# Patient Record
Sex: Male | Born: 1990 | Race: White | Hispanic: No | Marital: Married | Smoking: Former smoker
Health system: Southern US, Community
[De-identification: ages and names within clinical notes are randomized; demographics above are authoritative.]

## PROBLEM LIST (undated history)

## (undated) HISTORY — PX: APPENDECTOMY: SHX54

---
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
## 2012-06-24 ENCOUNTER — Emergency Department (HOSPITAL_COMMUNITY): Payer: No Typology Code available for payment source

## 2012-06-24 ENCOUNTER — Emergency Department (HOSPITAL_COMMUNITY)
Admission: EM | Admit: 2012-06-24 | Discharge: 2012-06-24 | Disposition: A | Discharge status: Home / Self Care | Payer: No Typology Code available for payment source | Attending: Emergency Medicine | Admitting: Emergency Medicine

## 2012-06-24 ENCOUNTER — Encounter (HOSPITAL_COMMUNITY): Payer: Self-pay

## 2012-06-24 ENCOUNTER — Emergency Department (HOSPITAL_COMMUNITY): Payer: Self-pay

## 2012-06-24 DIAGNOSIS — S139XXA Sprain of joints and ligaments of unspecified parts of neck, initial encounter: Secondary | ICD-10-CM | POA: Insufficient documentation

## 2012-06-24 DIAGNOSIS — S39012A Strain of muscle, fascia and tendon of lower back, initial encounter: Secondary | ICD-10-CM

## 2012-06-24 DIAGNOSIS — S335XXA Sprain of ligaments of lumbar spine, initial encounter: Secondary | ICD-10-CM | POA: Insufficient documentation

## 2012-06-24 DIAGNOSIS — S161XXA Strain of muscle, fascia and tendon at neck level, initial encounter: Secondary | ICD-10-CM

## 2012-06-24 DIAGNOSIS — Y92410 Unspecified street and highway as the place of occurrence of the external cause: Secondary | ICD-10-CM | POA: Insufficient documentation

## 2012-06-24 DIAGNOSIS — Y9389 Activity, other specified: Secondary | ICD-10-CM | POA: Insufficient documentation

## 2012-06-24 MED ORDER — OXYCODONE-ACETAMINOPHEN 5-325 MG PO TABS
2.0000 | ORAL_TABLET | Freq: Once | ORAL | Status: AC
  Administered 2012-06-24: 2 via ORAL

## 2012-06-24 MED ORDER — NAPROXEN 250 MG PO TABS: 250.0000 mg | ORAL_TABLET | Freq: Two times a day (BID) | ORAL | Status: DC

## 2012-06-24 MED ORDER — METHOCARBAMOL 500 MG PO TABS: 1000.0000 mg | ORAL_TABLET | Freq: Four times a day (QID) | ORAL | Status: DC | PRN

## 2012-06-24 MED ORDER — OXYCODONE-ACETAMINOPHEN 5-325 MG PO TABS
ORAL_TABLET | ORAL | Status: AC
  Administered 2012-06-24: 2 via ORAL
  Filled 2012-06-24: qty 2

## 2012-06-24 MED ORDER — OXYCODONE-ACETAMINOPHEN 5-325 MG PO TABS: ORAL_TABLET | ORAL | Status: DC

## 2012-06-24 NOTE — ED Notes (Signed)
Small C-Collar placed on pt due to head and neck pain.

## 2012-06-24 NOTE — ED Notes (Signed)
Notified Dr. Clarene Duke about pt and she is putting in xrays.
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
## 2012-07-14 ENCOUNTER — Encounter: Payer: Self-pay | Admitting: Family Medicine

## 2012-07-25 ENCOUNTER — Encounter: Payer: Self-pay | Admitting: Family Medicine

## 2012-07-25 ENCOUNTER — Ambulatory Visit (INDEPENDENT_AMBULATORY_CARE_PROVIDER_SITE_OTHER): Payer: Self-pay | Admitting: Family Medicine

## 2012-07-25 VITALS — BP 132/78 | Temp 99.0°F | Wt 175.4 lb

## 2012-07-25 DIAGNOSIS — R319 Hematuria, unspecified: Secondary | ICD-10-CM

## 2012-07-25 DIAGNOSIS — D229 Melanocytic nevi, unspecified: Secondary | ICD-10-CM

## 2012-07-25 DIAGNOSIS — D239 Other benign neoplasm of skin, unspecified: Secondary | ICD-10-CM

## 2012-07-25 LAB — POCT URINALYSIS DIPSTICK
Spec Grav, UA: 1.02
pH, UA: 7

## 2012-07-25 NOTE — Patient Instructions (Signed)
Return urine to [place]

## 2012-07-25 NOTE — Progress Notes (Signed)
  Subjective:    Patient ID: Jesse Riley, male    DOB: 12-23-1990, 23 y.o.   MRN: [id number]  Hematuria This is a new problem. The current episode started in the past 7 days. He is experiencing no pain. Irritative symptoms include frequency. Associated symptoms include fever.   He states that he had one time repeat where at the end of his urination he thought he saw a drop of blood mixed in with the urine. He's not had any problems with this since. He denies any lower bowel pain he denies fever chills flank pain. He denies sweats weight loss nausea vomiting diarrhea or appetite change.  Patient has family history of abnormal moles he states his aunt had multiple cancers taken off her back he was concerned about moles on his back. He does not smoke.   Review of Systems  Constitutional: Positive for fever.  Genitourinary: Positive for frequency and hematuria.       Objective:   Physical Exam Lungs are clear, heart regular, abdomen soft, skin warm dry, blood pressure good pulse good. Patient unable to give urinalysis currently he will bring him back later today for [place] to look at. On his back he has multiple light tan moles which are not worrisome but he has 2 small moles on his back one on the right mid area that is very dark and it has a well circumscribed edge but it's still concerning for possibility of atypical melanoma       Assessment & Plan:  Referral to dermatology for removal of mole  Hematuria-he is to bring a urinalysis sample back we will look at it later today if no blood then will read look at this again in 4-6 weeks. He was told that if he starts having gross hematuria he needs call back right away.

## 2012-08-04 ENCOUNTER — Encounter: Payer: Self-pay | Admitting: Family Medicine

## 2012-09-26 ENCOUNTER — Telehealth: Payer: Self-pay | Admitting: Family Medicine

## 2012-09-26 ENCOUNTER — Encounter: Payer: Self-pay | Admitting: Family Medicine

## 2012-09-26 NOTE — Telephone Encounter (Signed)
Clarify date. I'm ok with it

## 2012-09-26 NOTE — Telephone Encounter (Signed)
Please give note

## 2012-09-26 NOTE — Telephone Encounter (Signed)
Note printed.  Called [phone] however there was no answer.

## 2012-09-26 NOTE — Telephone Encounter (Signed)
Requesting a doctors excuse for today for work with no restrictions. He pulled something in his back yesterday at work and he was in pain this morning, but he is treating with anti-inflammatories and muscle relaxers. Will make appt. If not any better soon. Please call Marchelle Folks at [phone] to let me know since its my half day and I will come pick up.

## 2012-09-29 ENCOUNTER — Ambulatory Visit: Payer: Self-pay | Admitting: Family Medicine

## 2012-11-09 ENCOUNTER — Encounter: Payer: Self-pay | Admitting: Family Medicine

## 2012-11-10 ENCOUNTER — Encounter: Payer: Self-pay | Admitting: Family Medicine

## 2012-11-27 ENCOUNTER — Encounter: Payer: Self-pay | Admitting: Family Medicine

## 2013-06-04 ENCOUNTER — Encounter: Payer: Self-pay | Admitting: Family Medicine

## 2013-06-11 ENCOUNTER — Encounter: Payer: BC Managed Care – PPO | Admitting: Family Medicine

## 2013-08-17 ENCOUNTER — Encounter: Payer: BC Managed Care – PPO | Admitting: Family Medicine

## 2013-10-25 ENCOUNTER — Encounter: Payer: BC Managed Care – PPO | Admitting: Family Medicine

## 2014-02-15 ENCOUNTER — Ambulatory Visit (INDEPENDENT_AMBULATORY_CARE_PROVIDER_SITE_OTHER): Payer: PRIVATE HEALTH INSURANCE | Admitting: Family Medicine

## 2014-02-15 ENCOUNTER — Encounter: Payer: Self-pay | Admitting: Family Medicine

## 2014-02-15 VITALS — BP 120/78 | Temp 98.6°F | Wt 185.2 lb

## 2014-02-15 DIAGNOSIS — R319 Hematuria, unspecified: Secondary | ICD-10-CM

## 2014-02-15 LAB — POCT URINALYSIS DIPSTICK
PH UA: 6
Spec Grav, UA: 1.025

## 2014-02-15 NOTE — Progress Notes (Signed)
   Subjective:    Patient ID: Jesse Riley, male    DOB: [birth year], 23 y.o.   MRN: [id number]  Hematuria This is a new problem. The current episode started today. The problem is unchanged. The hematuria occurs during the initial portion of his urinary stream. He reports no clotting in his urine stream. His pain is at a severity of 0/10. He is experiencing no pain. (Sharp pains around rib cage) He is sexually active.  Patient states he has no other concerns at this time.   PMH benign  Review of Systems  Genitourinary: Positive for hematuria.  patient denies fever vomiting diarrhea. Denies flank pain or dysuria     Objective:   Physical Exam  Lungs are clear hearts regular abdomen soft no guarding rebound flanks nontender genitourinary normal  Urinalysis without RBCs    Assessment & Plan:  hematuria-because this is intermittent and is months at a time I doubt underlying cancer but I do believe because it keeps reoccurring the patient needs cystoscope. Referral to [facility].  Lab work ordered.

## 2014-02-20 ENCOUNTER — Encounter: Payer: Self-pay | Admitting: Family Medicine

## 2014-04-30 ENCOUNTER — Ambulatory Visit: Payer: Self-pay | Admitting: Urology
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
## 2014-11-18 ENCOUNTER — Telehealth: Payer: Self-pay | Admitting: Family Medicine

## 2014-11-18 MED ORDER — PREDNISONE 20 MG PO TABS: ORAL_TABLET | ORAL | Status: DC

## 2014-11-18 NOTE — Telephone Encounter (Signed)
Medication sent to the pharmacy. Left message on voicemail notifying patient.

## 2014-11-18 NOTE — Telephone Encounter (Signed)
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  It is spreading further up his arms.  He has tried calamine lotion, vinegar, and several other OTC products.  Can we call in something for this? He is self-pay, so cheapest preferred.    [facility] [place]

## 2014-11-18 NOTE — Telephone Encounter (Signed)
Treatment for this that should be affordable his prednisone tablets., #18,3qd for 3d then 2qd for 3d then 1qd for 3d

## 2015-12-17 ENCOUNTER — Telehealth: Payer: Self-pay | Admitting: Family Medicine

## 2015-12-17 ENCOUNTER — Other Ambulatory Visit: Payer: Self-pay | Admitting: Family Medicine

## 2015-12-17 DIAGNOSIS — R5383 Other fatigue: Secondary | ICD-10-CM

## 2015-12-17 DIAGNOSIS — Z13220 Encounter for screening for lipoid disorders: Secondary | ICD-10-CM

## 2015-12-17 DIAGNOSIS — Z79899 Other long term (current) drug therapy: Secondary | ICD-10-CM

## 2015-12-17 NOTE — Telephone Encounter (Signed)
Pt is needing lab orders to be sent over for an upcoming physical. There are no previous labs in Epic.

## 2015-12-17 NOTE — Telephone Encounter (Signed)
Lipid, liver, metabolic 7, CBC 

## 2015-12-18 NOTE — Telephone Encounter (Signed)
Notified Estill Bamberg bloodwork has been ordered.

## 2016-01-14 LAB — CBC WITH DIFFERENTIAL/PLATELET
BASOS ABS: 0 10*3/uL (ref 0.0–0.2)
Basos: 0 %
EOS (ABSOLUTE): 0.1 10*3/uL (ref 0.0–0.4)
Eos: 1 %
HEMOGLOBIN: 17.4 g/dL (ref 12.6–17.7)
Hematocrit: 48.3 % (ref 37.5–51.0)
Immature Grans (Abs): 0 10*3/uL (ref 0.0–0.1)
Immature Granulocytes: 0 %
LYMPHS ABS: 2.5 10*3/uL (ref 0.7–3.1)
Lymphs: 39 %
MCH: 30.3 pg (ref 26.6–33.0)
MCHC: 36 g/dL — AB (ref 31.5–35.7)
MCV: 84 fL (ref 79–97)
MONOCYTES: 6 %
MONOS ABS: 0.4 10*3/uL (ref 0.1–0.9)
NEUTROS ABS: 3.5 10*3/uL (ref 1.4–7.0)
Neutrophils: 54 %
Platelets: 219 10*3/uL (ref 150–379)
RBC: 5.75 x10E6/uL (ref 4.14–5.80)
RDW: 12.7 % (ref 12.3–15.4)
WBC: 6.4 10*3/uL (ref 3.4–10.8)

## 2016-01-14 LAB — LIPID PANEL
CHOLESTEROL TOTAL: 178 mg/dL (ref 100–199)
Chol/HDL Ratio: 4.1 ratio units (ref 0.0–5.0)
HDL: 43 mg/dL (ref >39)
LDL Calculated: 105 mg/dL — ABNORMAL HIGH (ref 0–99)
TRIGLYCERIDES: 151 mg/dL — AB (ref 0–149)
VLDL CHOLESTEROL CAL: 30 mg/dL (ref 5–40)

## 2016-01-14 LAB — BASIC METABOLIC PANEL
BUN / CREAT RATIO: 13 (ref 9–20)
BUN: 13 mg/dL (ref 6–20)
CHLORIDE: 99 mmol/L (ref 96–106)
CO2: 25 mmol/L (ref 18–29)
Calcium: 9.7 mg/dL (ref 8.7–10.2)
Creatinine, Ser: 1.02 mg/dL (ref 0.76–1.27)
GFR calc non Af Amer: 101 mL/min/{1.73_m2} (ref >59)
GFR, EST AFRICAN AMERICAN: 117 mL/min/{1.73_m2} (ref >59)
GLUCOSE: 112 mg/dL — AB (ref 65–99)
POTASSIUM: 4.1 mmol/L (ref 3.5–5.2)
SODIUM: 142 mmol/L (ref 134–144)

## 2016-01-14 LAB — HEPATIC FUNCTION PANEL
ALK PHOS: 88 IU/L (ref 39–117)
ALT: 36 IU/L (ref 0–44)
AST: 21 IU/L (ref 0–40)
Albumin: 4.8 g/dL (ref 3.5–5.5)
BILIRUBIN TOTAL: 0.7 mg/dL (ref 0.0–1.2)
BILIRUBIN, DIRECT: 0.12 mg/dL (ref 0.00–0.40)
Total Protein: 7 g/dL (ref 6.0–8.5)

## 2016-01-19 ENCOUNTER — Encounter: Payer: Self-pay | Admitting: Family Medicine

## 2016-01-20 ENCOUNTER — Encounter: Payer: Self-pay | Admitting: Family Medicine

## 2016-01-20 ENCOUNTER — Ambulatory Visit (INDEPENDENT_AMBULATORY_CARE_PROVIDER_SITE_OTHER): Payer: BLUE CROSS/BLUE SHIELD | Admitting: Family Medicine

## 2016-01-20 VITALS — BP 118/78 | Ht 70.0 in | Wt 187.5 lb

## 2016-01-20 DIAGNOSIS — Z0000 Encounter for general adult medical examination without abnormal findings: Secondary | ICD-10-CM

## 2016-01-20 DIAGNOSIS — R7301 Impaired fasting glucose: Secondary | ICD-10-CM

## 2016-01-20 DIAGNOSIS — E7849 Other hyperlipidemia: Secondary | ICD-10-CM

## 2016-01-20 DIAGNOSIS — E784 Other hyperlipidemia: Secondary | ICD-10-CM

## 2016-01-20 NOTE — Progress Notes (Addendum)
   Subjective:    Patient ID: Jesse Riley, male    DOB: 30-Jul-1990, 25 y.o.   MRN: [id number]  HPI The patient comes in today for a wellness visit.  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 He does try to stay active but does not do any exercise on a regular basis he is safe with his driving habits he does not smoke or drink. He does not eat healthy. He drinks too much sweet tea. He does have a family history of diabetes but no premature heart disease. Denies any rectal bleeding or hematuria.  A review of their health history was completed.  A review of medications was also completed.  Any needed refills: no meds currently  Eating habits: not conscious about eating habits  Falls/  MVA accidents in past few months: none  Regular exercise: walking  Specialist pt sees on regular basis: none  Preventative health issues were discussed.   Additional concerns: none    Review of Systems  Constitutional: Negative for activity change, appetite change and fever.  HENT: Negative for congestion and rhinorrhea.   Eyes: Negative for discharge.  Respiratory: Negative for cough and wheezing.   Cardiovascular: Negative for chest pain.  Gastrointestinal: Negative for abdominal pain, blood in stool and vomiting.  Genitourinary: Negative for difficulty urinating and frequency.  Musculoskeletal: Negative for neck pain.  Skin: Negative for rash.  Allergic/Immunologic: Negative for environmental allergies and food allergies.  Neurological: Negative for weakness and headaches.  Psychiatric/Behavioral: Negative for agitation.       Objective:   Physical Exam  Constitutional: He appears well-developed and well-nourished.  HENT:  Head: Normocephalic and atraumatic.  Right Ear: External ear normal.  Left Ear: External ear normal.  Nose: Nose normal.  Mouth/Throat: Oropharynx is clear and moist.  Eyes: EOM are normal. Pupils are equal, round, and reactive to light.  Neck:  Normal range of motion. Neck supple. No thyromegaly present.  Cardiovascular: Normal rate, regular rhythm and normal heart sounds.   No murmur heard. Pulmonary/Chest: Effort normal and breath sounds normal. No respiratory distress. He has no wheezes.  Abdominal: Soft. Bowel sounds are normal. He exhibits no distension and no mass. There is no tenderness.  Genitourinary: Penis normal.  Musculoskeletal: Normal range of motion. He exhibits no edema.  Lymphadenopathy:    He has no cervical adenopathy.  Neurological: He is alert. He exhibits normal muscle tone.  Skin: Skin is warm and dry. No erythema.  Psychiatric: He has a normal mood and affect. His behavior is normal. Judgment normal.    Testicular exam normal, testicular cancer detection discussed      Assessment & Plan:  Adult wellness-complete.wellness physical was conducted today. Importance of diet and exercise were discussed in detail. In addition to this a discussion regarding safety was also covered. We also reviewed over immunizations and gave recommendations regarding current immunization needed for age. In addition to this additional areas were also touched on including: Preventative health exams needed: Colonoscopy Colonoscopy not indicated Prostate exam not indicated Hyperlipidemia minimal elevation watch diet exercise more often Fasting hyperglycemia watch diet work hard on avoiding sugary drinks recheck labs in the spring  Patient was advised yearly wellness exam

## 2017-01-27 ENCOUNTER — Ambulatory Visit: Payer: BLUE CROSS/BLUE SHIELD | Admitting: Adult Health

## 2017-01-27 VITALS — BP 122/64 | HR 83 | Temp 97.3°F | Resp 16 | Ht 71.0 in | Wt 182.0 lb

## 2017-01-27 DIAGNOSIS — H6122 Impacted cerumen, left ear: Secondary | ICD-10-CM

## 2017-01-27 DIAGNOSIS — H6502 Acute serous otitis media, left ear: Secondary | ICD-10-CM

## 2017-01-27 MED ORDER — AZITHROMYCIN 250 MG PO TABS: ORAL_TABLET | ORAL | 0 refills | Status: DC

## 2017-01-27 NOTE — Patient Instructions (Signed)
Azithromycin tablets What is this medicine? AZITHROMYCIN (az ith roe MYE sin) is a macrolide antibiotic. It is used to treat or prevent certain kinds of bacterial infections. It will not work for colds, flu, or other viral infections. This medicine may be used for other purposes; ask your health care provider or pharmacist if you have questions. COMMON BRAND NAME(S): Zithromax, Zithromax Tri-Pak, Zithromax Z-Pak What should I tell my health care provider before I take this medicine? They need to know if you have any of these conditions: -kidney disease -liver disease -irregular heartbeat or heart disease -an unusual or allergic reaction to azithromycin, erythromycin, other macrolide antibiotics, foods, dyes, or preservatives -pregnant or trying to get pregnant -breast-feeding How should I use this medicine? Take this medicine by mouth with a full glass of water. Follow the directions on the prescription label. The tablets can be taken with food or on an empty stomach. If the medicine upsets your stomach, take it with food. Take your medicine at regular intervals. Do not take your medicine more often than directed. Take all of your medicine as directed even if you think your are better. Do not skip doses or stop your medicine early. Talk to your pediatrician regarding the use of this medicine in children. While this drug may be prescribed for children as young as 6 months for selected conditions, precautions do apply. Overdosage: If you think you have taken too much of this medicine contact a poison control center or emergency room at once. NOTE: This medicine is only for you. Do not share this medicine with others. What if I miss a dose? If you miss a dose, take it as soon as you can. If it is almost time for your next dose, take only that dose. Do not take double or extra doses. What may interact with this medicine? Do not take this medicine with any of the following  medications: -lincomycin This medicine may also interact with the following medications: -amiodarone -antacids -birth control pills -cyclosporine -digoxin -magnesium -nelfinavir -phenytoin -warfarin This list may not describe all possible interactions. Give your health care provider a list of all the medicines, herbs, non-prescription drugs, or dietary supplements you use. Also tell them if you smoke, drink alcohol, or use illegal drugs. Some items may interact with your medicine. What should I watch for while using this medicine? Tell your doctor or healthcare professional if your symptoms do not start to get better or if they get worse. Do not treat diarrhea with over the counter products. Contact your doctor if you have diarrhea that lasts more than 2 days or if it is severe and watery. This medicine can make you more sensitive to the sun. Keep out of the sun. If you cannot avoid being in the sun, wear protective clothing and use sunscreen. Do not use sun lamps or tanning beds/booths. What side effects may I notice from receiving this medicine? Side effects that you should report to your doctor or health care professional as soon as possible: -allergic reactions like skin rash, itching or hives, swelling of the face, lips, or tongue -confusion, nightmares or hallucinations -dark urine -difficulty breathing -hearing loss -irregular heartbeat or chest pain -pain or difficulty passing urine -redness, blistering, peeling or loosening of the skin, including inside the mouth -white patches or sores in the mouth -yellowing of the eyes or skin Side effects that usually do not require medical attention (report to your doctor or health care professional if they continue or are bothersome): -  diarrhea -dizziness, drowsiness -headache -stomach upset or vomiting -tooth discoloration -vaginal irritation This list may not describe all possible side effects. Call your doctor for medical advice  about side effects. You may report side effects to FDA at 1-800-FDA-1088. Where should I keep my medicine? Keep out of the reach of children. Store at room temperature between 15 and 30 degrees C (59 and 86 degrees F). Throw away any unused medicine after the expiration date. NOTE: This sheet is a summary. It may not cover all possible information. If you have questions about this medicine, talk to your doctor, pharmacist, or health care provider.  2018 Elsevier/Gold Standard (2015-05-20 15:26:03) Otitis Media, Adult Otitis media is redness, soreness, and puffiness (swelling) in the space just behind your eardrum (middle ear). It may be caused by allergies or infection. It often happens along with a cold. Follow these instructions at home:  Take your medicine as told. Finish it even if you start to feel better.  Only take over-the-counter or prescription medicines for pain, discomfort, or fever as told by your doctor.  Follow up with your doctor as told. Contact a doctor if:  You have otitis media only in one ear, or bleeding from your nose, or both.  You notice a lump on your neck.  You are not getting better in 3-5 days.  You feel worse instead of better. Get help right away if:  You have pain that is not helped with medicine.  You have puffiness, redness, or pain around your ear.  You get a stiff neck.  You cannot move part of your face (paralysis).  You notice that the bone behind your ear hurts when you touch it. This information is not intended to replace advice given to you by your health care provider. Make sure you discuss any questions you have with your health care provider. Document Released: 09/08/2007 Document Revised: 08/28/2015 Document Reviewed: 10/17/2012 Elsevier Interactive Patient Education  2017 [employer].

## 2017-01-27 NOTE — Progress Notes (Addendum)
Subjective:     Patient ID: Jesse Riley, male   DOB: 1990/08/22, 26 y.o.   MRN: [id number]  HPI   Patient is a 26 year old male in no acute distress, he is here because he used a two weeks ago he used a Q-tip to clean his ears. He reports he has a lot of ear wax and he is constantly trying to remove it. He reports he put a q-tip in his left ear  and he poked his ear with it and realized he had no cotton on the swab. He feels the swab likely had no cotton on the end when he inserted it  into his left  ear.  He denies any pain or drainage at all since this happened and ne at the time of incident either.  He denies any change in his normal hearing.  He denies any other symptoms or concerns.   Blood pressure 122/64, pulse 83, temperature (!) 97.3 F (36.3 C), temperature source Tympanic, resp. rate 16, height 5\' 11"  (1.803 m), weight 182 lb (82.6 kg), SpO2 97 %. Recheck temperature 98.4 tympanic.  Allergies  Allergen Reactions  . Ceclor [Cefaclor]    Luking, Elayne Snare, MD- patients  PCP  Patient Active Problem List   Diagnosis Date Noted  . Fasting hyperglycemia 01/20/2016  . Other hyperlipidemia 01/20/2016      Review of Systems  Constitutional: Negative for activity change, appetite change, chills, diaphoresis, fatigue, fever and unexpected weight change.  HENT: Positive for ear pain (tender patient felt when inserting q-tips to remove wax this week as well). Negative for congestion, dental problem, drooling, ear discharge, facial swelling, hearing loss, mouth sores, nosebleeds, postnasal drip, rhinorrhea, sinus pain, sinus pressure, sneezing, sore throat, tinnitus, trouble swallowing and voice change.   Eyes: Negative.   Respiratory: Negative for apnea, cough, choking, chest tightness, shortness of breath, wheezing and stridor.   Cardiovascular: Negative for chest pain, palpitations and leg swelling.  Gastrointestinal: Negative.   Endocrine: Negative.   Genitourinary: Negative.    Musculoskeletal: Negative.   Skin: Negative.   Allergic/Immunologic: Negative.   Neurological: Negative.   Psychiatric/Behavioral: Negative.        Objective:   Physical Exam  Constitutional: He is oriented to person, place, and time. He appears well-developed and well-nourished. No distress.  HENT:  Head: Normocephalic and atraumatic.  Right Ear: Hearing, external ear and ear canal normal. No drainage or tenderness. No foreign bodies. No mastoid tenderness. Tympanic membrane is not perforated and not erythematous. No middle ear effusion.  Left Ear: Hearing, external ear and ear canal normal. No lacerations. No drainage, swelling or tenderness (not with touch or examination). No foreign bodies. No mastoid tenderness. Tympanic membrane is erythematous (mild in portion able to visualize around half of tympanic membrane visualized after irrigation. ). Tympanic membrane is not injected, not perforated (not able to visualize full tympanic membrane/ no perforatioin noted in visualized area), not retracted and not bulging.  No middle ear effusion.  Nose: Nose normal. Right sinus exhibits no maxillary sinus tenderness and no frontal sinus tenderness. Left sinus exhibits no maxillary sinus tenderness and no frontal sinus tenderness.  Mouth/Throat: Uvula is midline, oropharynx is clear and moist and mucous membranes are normal. No oropharyngeal exudate, posterior oropharyngeal edema, posterior oropharyngeal erythema or tonsillar abscesses.  Right ear with mild cerumen present able to fully  visualize tympanic membrane.  Left  ear unable to visualize tympanic membrane due to cerumen in ear canal, irrigated by RN  Sonia Baller, after irrigation was able to partially visualize tympanic membrane and documented under left ear/ other area  View occluded by cerumen.  Patient denied any pain with bilateral irrigation by nurse.   Eyes: Pupils are equal, round, and reactive to light. Conjunctivae, EOM and lids are  normal. Right eye exhibits no discharge. Left eye exhibits no discharge. No scleral icterus.  Neck: Trachea normal, normal range of motion and full passive range of motion without pain. Neck supple. Normal carotid pulses, no hepatojugular reflux and no JVD present. Carotid bruit is not present. No tracheal deviation present. No thyromegaly present.  Cardiovascular: Normal rate, regular rhythm, normal heart sounds and intact distal pulses.  Exam reveals no gallop and no friction rub.   No murmur heard. Pulmonary/Chest: Effort normal and breath sounds normal. No stridor. No respiratory distress. He has no wheezes. He has no rales. He exhibits no tenderness.  Abdominal: Soft.  Musculoskeletal: Normal range of motion. He exhibits no edema or tenderness.  Patient moves on and off of exam table and in room without difficulty. Gait is normal in hall and in room. Patient is oriented to person place time and situation. Patient answers questions appropriately and engages in conversation.   Lymphadenopathy:       Head (right side): No submental, no submandibular, no tonsillar, no preauricular, no posterior auricular and no occipital adenopathy present.       Head (left side): No submental, no submandibular, no tonsillar, no preauricular, no posterior auricular and no occipital adenopathy present.    He has no cervical adenopathy.  Neurological: He is alert and oriented to person, place, and time. He has normal reflexes. No cranial nerve deficit. Coordination normal.  Skin: Skin is warm and dry. He is not diaphoretic.  Psychiatric: He has a normal mood and affect. His speech is normal and behavior is normal. Judgment and thought content normal. Cognition and memory are normal.  Vitals reviewed.      Assessment:     Impacted cerumen of left ear - Plan: Ear Lavage  Acute serous otitis media of left ear, recurrence not specified      Plan:    E prescribed  Meds ordered this encounter  Medications  .  azithromycin (ZITHROMAX) 250 MG tablet    Sig: Take 2 tablets ( 500 mg ) on day one and one tablet (250 mg) for days two three four and five.    Dispense:  6 tablet    Refill:  0    He is advised to keep left ear dry ad do not submerge in water. He is to report any pain or discharge to this clinic and seek medical attention as below if worsens at anytime.    He is advised to schedule for a two week recheck appointment  of his left ear and sooner if any symptoms change or worsen.   He can follow up with Kathyrn Drown, MD as well.   Advised to return to clinic for an appointment if no improvement within 72 hours or if any symptoms change or worsen. Advised ER or urgent Care if after hours or on weekend. 911 for emergency symptoms at any time.   Patient verbalized understanding of instructions and denies any further questions at this time.

## 2017-02-10 ENCOUNTER — Ambulatory Visit: Payer: BLUE CROSS/BLUE SHIELD | Admitting: Adult Health

## 2017-09-22 ENCOUNTER — Ambulatory Visit: Payer: BLUE CROSS/BLUE SHIELD | Admitting: Adult Health

## 2017-09-22 ENCOUNTER — Encounter: Payer: Self-pay | Admitting: Adult Health

## 2017-09-22 VITALS — BP 122/66 | HR 78 | Temp 98.1°F | Resp 16 | Ht 71.0 in | Wt 178.0 lb

## 2017-09-22 DIAGNOSIS — M25531 Pain in right wrist: Secondary | ICD-10-CM

## 2017-09-22 DIAGNOSIS — M79641 Pain in right hand: Secondary | ICD-10-CM

## 2017-09-22 NOTE — Progress Notes (Signed)
Subjective:     Patient ID: Jesse Riley, male   DOB: December 05, 1990, 27 y.o.   MRN: [id number]  Blood pressure 122/66, pulse 78, temperature 98.1 F (36.7 C), resp. rate 16, height 5\' 11"  (1.803 m), weight 178 lb (80.7 kg), SpO2 100 %.  HPI   Patient is a 27 year old male in no acute distress who comes to the clinic in no acute distress. He had a fall while playing  basket ball he had jumped 3 to 4 feet  in the air and fell on right hip and landed with arm outstretched and  palm down on concrete floor. He has pain with bearing weight on hand. Not taking any medications.  He has significant swelling in his right hand/wrist and now has improved some with minimal swelling now. He reports feeling a " knot " at his wrist area when he moves in certain positions. He reports pain in hand while witting or twisting. He is right hand dominant.   Denies any previous known injury. Patient  denies any fever, body aches,chills, rash, chest pain, shortness of breath, nausea, vomiting, or diarrhea.    Review of Systems  Constitutional: Negative.   HENT: Negative.   Eyes: Negative.   Respiratory: Negative.   Cardiovascular: Negative.   Gastrointestinal: Negative.   Endocrine: Negative.   Genitourinary: Negative.   Musculoskeletal: Positive for arthralgias and joint swelling. Negative for back pain, gait problem, myalgias, neck pain and neck stiffness.       Fall over one week ago see HPI note  Fall onto outstretched hand [place] Fall   Skin: Negative.   Allergic/Immunologic:        -- Ceclor (Cefaclor)    Neurological: Negative.   Hematological: Negative.   Psychiatric/Behavioral: Negative.        Objective:   Physical Exam  Constitutional: He is oriented to person, place, and time. He appears well-developed and well-nourished. No distress.  HENT:  Head: Normocephalic and atraumatic.  Nose: Nose normal.  Mouth/Throat: No oropharyngeal exudate.  Eyes: Pupils are equal, round, and reactive to  light. Conjunctivae and EOM are normal.  Neck: Normal range of motion. Neck supple.  Cardiovascular: Normal rate, regular rhythm, normal heart sounds and intact distal pulses. Exam reveals no gallop and no friction rub.  No murmur heard. Pulmonary/Chest: Effort normal and breath sounds normal. No stridor. No respiratory distress. He has no wheezes. He has no rales. He exhibits no tenderness.  Abdominal: Soft.  Musculoskeletal:       Right shoulder: Normal.       Left shoulder: Normal.       Right elbow: Normal.      Left elbow: Normal.       Right wrist: He exhibits decreased range of motion (causes pain with FULL ROM ), tenderness, swelling and deformity (at anterior wrist appers to be small bony protrusion under skin  with certain movements ). He exhibits no effusion, no crepitus and no laceration. Bony tenderness: wrist  and hand        Left wrist: Normal.       Right hip: Normal.       Left hip: Normal.       Arms: Neurological: He is alert and oriented to person, place, and time.  Skin: Skin is warm and dry. Capillary refill takes less than 2 seconds. No rash noted. He is not diaphoretic. No erythema. No pallor.  Psychiatric: He has a normal mood and affect. His behavior is normal.  Judgment and thought content normal.  Vitals reviewed.      Assessment:     Pain in wrist, right  Pain of right hand  FOOSH type injury      Plan:     Refer to [facility] today for further evaluation and work up.   Advised patient call the office or your primary care doctor for an appointment if no improvement within 72 hours or if any symptoms change or worsen at any time  Advised ER or urgent Care if after hours or on weekend. Call 911 for emergency symptoms at any time.Patinet verbalized understanding of all instructions given/reviewed and treatment plan and has no further questions or concerns at this time.    Patient verbalized understanding of all instructions given  and denies any further questions at this time.

## 2017-09-22 NOTE — Patient Instructions (Signed)
Wrist Arthroscopy, Care After Refer to this sheet in the next few weeks. These instructions provide you with information on caring for yourself after your procedure. Your health care provider may also give you more specific instructions. Your treatment has been planned according to current medical practices, but problems sometimes occur. Call your health care provider if you have any problems or questions after your procedure. What can I expect after the procedure? After your procedure, it is typical to have the following:  Pain and swelling at the site of the incisions.  Stiffness in your wrist. This should gradually decrease over time. Your health care provider may recommend physical therapy to help improve this.  Nausea, vomiting, or constipation. These symptoms can result from taking pain medicine after surgery.  Clear or red drainage from the incision sites. This is normal for a few days after surgery.  Fatigue.  Follow these instructions at home:  Take medicine only as directed by your health care provider.  Rest for a few days.  Keep your wrist raised above the level of your heart while you are resting.  Ice may help ease pain and swelling. ? Place ice in a plastic bag. ? Put a towel between your skin or splint and the bag. ? Leave the ice on for 20 minutes, 2-3 times a day.  Do not take baths, swim, or use a hot tub until your health care provider approves.  Keep your bandage (dressing) and splint dry. Pat the area dry with a clean towel. Do not rub the incision as this may cause bleeding.  There are many different ways to close and cover an incision, including stitches, skin glue, and adhesive strips. Follow your health care provider's instructions on: ? Incision care. ? Dressing changes and removal. ? Incision closure removal.  Keep all follow-up visits as directed by your health care provider. This is important. Contact a health care provider if: You have a  fever. Get help right away if:  You have drainage, redness, swelling, or increasing pain at the incision site.  You notice a bad smell coming from the incision site or dressing.  Your incision site breaks open after your stitches or tape has been removed. This information is not intended to replace advice given to you by your health care provider. Make sure you discuss any questions you have with your health care provider. Document Released: 10/17/2013 Document Revised: 08/28/2015 Document Reviewed: 07/12/2013 Elsevier Interactive Patient Education  2018 [place] [facility] Many things can cause hand pain. Some common causes are:  An injury.  Repeating the same movement with your hand over and over (overuse).  Osteoporosis.  Arthritis.  Lumps in the tendons or joints of the hand and wrist (ganglion cysts).  Infection.  Follow these instructions at home: Pay attention to any changes in your symptoms. Take these actions to help with your discomfort:  If directed, put ice on the affected area: ? Put ice in a plastic bag. ? Place a towel between your skin and the bag. ? Leave the ice on for 15-20 minutes, 3?4 times a day for 2 days.  Take over-the-counter and prescription medicines only as told by your health care provider.  Minimize stress on your hands and wrists as much as possible.  Take breaks from repetitive activity often.  Do stretches as told by your health care provider.  Do not do activities that make your pain worse.  Contact a health care provider if:  Your pain does  not get better after a few days of self-care.  Your pain gets worse.  Your pain affects your ability to do your daily activities. Get help right away if:  Your hand becomes warm, red, or swollen.  Your hand is numb or tingling.  Your hand is extremely swollen or deformed.  Your hand or fingers turn white or blue.  You cannot move your hand, wrist, or fingers. This information is  not intended to replace advice given to you by your health care provider. Make sure you discuss any questions you have with your health care provider. Document Released: 04/18/2015 Document Revised: 08/28/2015 Document Reviewed: 04/17/2014 Elsevier Interactive Patient Education  [employer].

## 2018-10-20 ENCOUNTER — Ambulatory Visit: Payer: BC Managed Care – PPO | Admitting: Nurse Practitioner

## 2018-10-20 ENCOUNTER — Encounter: Payer: Self-pay | Admitting: Medical

## 2018-10-20 ENCOUNTER — Other Ambulatory Visit: Payer: Self-pay

## 2018-10-20 VITALS — BP 127/71 | HR 77 | Temp 98.4°F | Resp 18 | Ht 70.0 in | Wt 182.0 lb

## 2018-10-20 DIAGNOSIS — S0592XA Unspecified injury of left eye and orbit, initial encounter: Secondary | ICD-10-CM

## 2018-10-20 NOTE — Patient Instructions (Addendum)
Encouraged to get a back up pair of glasses I don't encourage you to wear contacts to the left eye while that part of your eye is healing, but limit them as much as possible if you have to use them and make sure you wash your hands well before and after If your eye pain worsens, changes in your vision, eye drainage or the redness worsens see your eye doctor. Patient verbalized understanding of all instructions given/reviewed and has no further questions or concerns at this time.

## 2018-10-20 NOTE — Progress Notes (Signed)
   Subjective:    Patient ID: Jesse Riley, male    DOB: 10-18-1990, 28 y.o.   MRN: [id number]  HPI Rual is here today to get his eye checked because he hit the outside of his inner left eye with a metal strip. He reports the redness is improving and not as painful. No self treatment. He denies any eye drainage, itching, or visual disturbance. Has continued to wear eye glasses because he doesn't have back up glasses and can't see without contacts. 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 He admits when he takes them out at night his eye feels better and he has been mindful to remove them daily as directed since this occurred.     Review of Systems  Constitutional: Negative for fever.  Eyes: Positive for pain and redness. Negative for photophobia, discharge, itching and visual disturbance.  Respiratory: Negative for cough and shortness of breath.   Cardiovascular: Negative for chest pain.  Skin: Negative for rash.       Objective:   Physical Exam Constitutional:      Appearance: Normal appearance. He is normal weight.  HENT:     Head: Normocephalic and atraumatic.  Eyes:     General: No scleral icterus.       Right eye: No discharge.        Left eye: No discharge.     Extraocular Movements: Extraocular movements intact.     Conjunctiva/sclera: Conjunctivae normal.     Pupils: Pupils are equal, round, and reactive to light.     Comments: Left lateral sclera closer to the outer canthus mildly injected. No evidence of trauma or abrasion to the cornea. Patient is wearing contacts. EOM intact and pupils 2+ with PERRL.  Neck:     Musculoskeletal: Normal range of motion and neck supple.  Skin:    General: Skin is warm and dry.  Neurological:     General: No focal deficit present.     Mental Status: He is alert.  Psychiatric:        Mood and Affect: Mood normal.           Assessment & Plan:

## 2020-03-31 ENCOUNTER — Other Ambulatory Visit: Payer: Self-pay

## 2020-03-31 ENCOUNTER — Telehealth: Payer: Self-pay | Admitting: Nurse Practitioner

## 2020-03-31 ENCOUNTER — Ambulatory Visit: Payer: BC Managed Care – PPO | Admitting: Nurse Practitioner

## 2020-03-31 DIAGNOSIS — Z20822 Contact with and (suspected) exposure to covid-19: Secondary | ICD-10-CM

## 2020-03-31 LAB — POC COVID19 BINAXNOW: SARS Coronavirus 2 Ag: NEGATIVE

## 2020-03-31 NOTE — Progress Notes (Signed)
   Subjective:    Patient ID: Jesse Riley, male    DOB: 23-Feb-1991, 29 y.o.   MRN: [id number]  HPI 29 year old male here for COVID testing as requested by [employer] He was exposed to COVID on the 03/19/20 Started to have symptoms on 03/20/20 Tested positive on 03/21/20 with a home test  Retested himself yesterday at home and was negative, symptoms have mostly resolved at this time- minor congested has continued.       Review of Systems  Constitutional: Negative.   HENT: Positive for congestion.   Respiratory: Negative.   Cardiovascular: Negative.   Genitourinary: Negative.   Neurological: Negative.        Objective:   Physical Exam  This was a virtual appointment with patient after COVID testing with nurse.  No acute distress apparent during phone conversation.    Recent Results (from the past 2160 hour(s))  POC COVID-19     Status: Normal   Collection Time: 03/31/20  1:11 PM  Result Value Ref Range   SARS Coronavirus 2 Ag Negative Negative    Comment: Patient unvaccinated. Patient aware of negatie result. Virtual f/u with S.Carla Whilden       Assessment & Plan:  Rapid COVID testing negative today, it has been 10 days since he tested positive at home and > 10 days since symptom onset so he is cleared to return to work tomorrow.   RTC with any new or persistent symptoms as discussed.

## 2022-05-10 DIAGNOSIS — L821 Other seborrheic keratosis: Secondary | ICD-10-CM | POA: Diagnosis not present

## 2022-05-10 DIAGNOSIS — L818 Other specified disorders of pigmentation: Secondary | ICD-10-CM | POA: Diagnosis not present

## 2022-11-03 ENCOUNTER — Ambulatory Visit (INDEPENDENT_AMBULATORY_CARE_PROVIDER_SITE_OTHER): Payer: Self-pay | Admitting: Podiatry

## 2022-11-03 DIAGNOSIS — Z91199 Patient's noncompliance with other medical treatment and regimen due to unspecified reason: Secondary | ICD-10-CM

## 2022-11-03 NOTE — Progress Notes (Signed)
Patient was no-show for appointment today 

## 2022-11-30 ENCOUNTER — Encounter: Payer: Self-pay | Admitting: Podiatry

## 2022-11-30 ENCOUNTER — Ambulatory Visit (INDEPENDENT_AMBULATORY_CARE_PROVIDER_SITE_OTHER): Payer: BC Managed Care – PPO | Admitting: Podiatry

## 2022-11-30 DIAGNOSIS — B070 Plantar wart: Secondary | ICD-10-CM

## 2022-11-30 NOTE — Progress Notes (Signed)
   Chief Complaint  Patient presents with   Plantar Warts    "I've had Plantar Warts before but I don't know what these are." N - plantar warts L - bilateral foot D - 1 year O - gradually worse C - sore  A - walking without cushion T - I tried the corn stickers, wart removal medicines    Subjective: 32 y.o. male presenting today as a new patient for evaluation of pain and tenderness associated to the bilateral feet.  He is concerned for warts.  He states that he has had warts before but these are becoming very symptomatic and painful.  He has tried some OTC corn stickers with minimal improvement.    No past medical history on file.  Objective: Physical Exam General: The patient is alert and oriented x3 in no acute distress.   Dermatology: Hyperkeratotic skin lesions noted to the plantar aspect of the bilateral feet approximately 1 cm in diameter. Pinpoint bleeding noted upon debridement. Skin is warm, dry and supple bilateral lower extremities. Negative for open lesions or macerations.   Vascular: Palpable pedal pulses bilaterally. No edema or erythema noted. Capillary refill within normal limits.   Neurological: Grossly intact via light touch   Musculoskeletal Exam: Tenderness on palpation to the noted skin lesions.  Range of motion within normal limits to all pedal and ankle joints bilateral. Muscle strength 5/5 in all groups bilateral.    Assessment: 1. plantar warts bilateral feet    Plan of Care:  -Patient was evaluated. -Excisional debridement of the plantar wart lesion(s) was performed using a chisel blade.  Salicylic acid was applied and the lesion(s) was dressed with a dry sterile dressing. 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 Felecia Shelling, DPM    [address]                                         [place], [place] [address]                Office [phone]  Fax [phone]

## 2022-12-28 ENCOUNTER — Ambulatory Visit: Payer: BC Managed Care – PPO | Admitting: Podiatry

## 2023-04-15 DIAGNOSIS — Z3009 Encounter for other general counseling and advice on contraception: Secondary | ICD-10-CM | POA: Diagnosis not present

## 2023-06-24 DIAGNOSIS — Z302 Encounter for sterilization: Secondary | ICD-10-CM | POA: Diagnosis not present

## 2023-10-14 ENCOUNTER — Telehealth: Admitting: Physician Assistant

## 2023-10-14 DIAGNOSIS — W57XXXA Bitten or stung by nonvenomous insect and other nonvenomous arthropods, initial encounter: Secondary | ICD-10-CM

## 2023-10-14 DIAGNOSIS — T148XXA Other injury of unspecified body region, initial encounter: Secondary | ICD-10-CM | POA: Diagnosis not present

## 2023-10-14 DIAGNOSIS — S30860A Insect bite (nonvenomous) of lower back and pelvis, initial encounter: Secondary | ICD-10-CM | POA: Diagnosis not present

## 2023-10-14 DIAGNOSIS — L089 Local infection of the skin and subcutaneous tissue, unspecified: Secondary | ICD-10-CM

## 2023-10-14 MED ORDER — DOXYCYCLINE HYCLATE 100 MG PO TABS: 100.0000 mg | ORAL_TABLET | Freq: Two times a day (BID) | ORAL | 0 refills | Status: AC

## 2023-10-14 NOTE — Progress Notes (Signed)
E-Visit for Tick Bite  Thank you for describing your tick bite, Here is how we plan to help! Based on the information that you shared with me it looks like you have A tick that bite that we will treat with a short course of doxycycline.  In most cases a tick bite is painless and does not itch.  Most tick bites in which the tick is quickly removed do not require prescriptions. Ticks can transmit several diseases if they are infected and remain attacked to your skin. Therefore the length that the tick was attached and any symptoms you have experienced after the bite are import to accurately develop your custom treatment plan. In most cases a single dose of doxycycline may prevent the development of a more serious condition.  Based on your information I have Provided a home care guide for tick bites and  instructions on when to call for help. and I have sent a short dose of doxycycline to the pharmacy you selected. Please make sure that you selected a pharmacy that is open now.  Which ticks  are associated with illness?  The Wood Tick (dog tick) is the size of a watermelon seed and can sometimes transmit [facility] spotted fever and [place] tick fever.   The Deer Tick (black-legged tick) is between the size of a poppy seed (pin head) and an apple seed, and can sometimes transmit Lyme disease.  A brown to black tick with a white splotch on its back is likely a male Amblyomma americanum ([place] tick). This tick has been associated with Southern Tick Associated illness ( STARI)  Lyme disease has become the most common tick-borne illness in the [place]. The risk of Lyme disease following a recognized deer tick bite is estimated to be 1%.  The majority of cases of Lyme disease start with a bull's eye rash at the site of the tick bite. The rash can occur days to weeks (typically 7-10 days) after a tick bite. Treatment with antibiotics is indicated if this rash appears. Flu-like symptoms  may accompany the rash, including: fever, chills, headaches, muscle aches, and fatigue. Removing ticks promptly may prevent tick borne disease.  What can be used to prevent Tick Bites?  Insect repellant with at leas 20% DEET. Wearing long pants with sock and shoes. Avoiding tall grass and heavily wooded areas. Checking your skin after being outdoors. Shower with a washcloth after outdoor exposures.  HOME CARE ADVICE FOR TICK BITE  Wood Tick Removal:  Use a pair of tweezers and grasp the wood tick close to the skin (on its head). Pull the wood tick straight upward without twisting or crushing it. Maintain a steady pressure until it releases its grip.   If tweezers aren't available, use fingers, a loop of thread around the jaws, or a needle between the jaws for traction.  Note: covering the tick with petroleum jelly, nail polish or rubbing alcohol doesn't work. Neither does touching the tick with a hot or cold object. Tiny Deer Tick Removal:   Needs to be scraped off with a knife blade or credit card edge. Place tick in a sealed container (e.g. glass jar, zip lock plastic bag), in case your doctor wants to see it. Tick's Head Removal:  If the wood tick's head breaks off in the skin, it must be removed. Clean the skin. Then use a sterile needle to uncover the head and lift it out or scrape it off.  If a very small piece  of the head remains, the skin will eventually slough it off. Antibiotic Ointment:  Wash the wound and your hands with soap and water after removal to prevent catching any tick disease.  Apply an over the counter antibiotic ointment (e.g. bacitracin) to the bite once. Expected Course: Tick bites normally don't itch or hurt. That's why they often go unnoticed. Call Your Doctor If:  You can't remove the tick or the tick's head Fever, a severe head ache, or rash occur in the next 2 weeks Bite begins to look infected Lyme's disease is common in your area You have not had a  tetanus in the last 10 years Your current symptoms become worse    MAKE SURE YOU  Understand these instructions. Will watch your condition. Will get help right away if you are not doing well or get worse.    Thank you for choosing an e-visit.  Your e-visit answers were reviewed by a board certified advanced clinical practitioner to complete your personal care plan. Depending upon the condition, your plan could have included both over the counter or prescription medications.  Please review your pharmacy choice. Make sure the pharmacy is open so you can pick up prescription now. If there is a problem, you may contact your provider through [employer] and have the prescription routed to another pharmacy.  Your safety is important to [place]. If you have drug allergies check your prescription carefully.   For the next 24 hours you can use MyChart to ask questions about today's visit, request a non-urgent call back, or ask for a work or school excuse. You will get an email in the next two days asking about your experience. I hope that your e-visit has been valuable and will speed your recovery.   I have spent 5 minutes in review of e-visit questionnaire, review and updating patient chart, medical decision making and response to patient.   Margaretann Loveless, PA-C

## 2023-10-31 ENCOUNTER — Encounter

## 2023-11-24 DIAGNOSIS — S39012A Strain of muscle, fascia and tendon of lower back, initial encounter: Secondary | ICD-10-CM | POA: Diagnosis not present

## 2023-11-24 DIAGNOSIS — X58XXXA Exposure to other specified factors, initial encounter: Secondary | ICD-10-CM | POA: Diagnosis not present

## 2023-11-25 ENCOUNTER — Ambulatory Visit: Admitting: Family Medicine
